# Patient Record
Sex: Male | Born: 1960 | Race: White | Hispanic: No | Marital: Single | Smoking: Current every day smoker
Health system: Southern US, Community
[De-identification: ages and names within clinical notes are randomized; demographics above are authoritative.]

## PROBLEM LIST (undated history)

## (undated) DIAGNOSIS — E119 Type 2 diabetes mellitus without complications: Secondary | ICD-10-CM

## (undated) DIAGNOSIS — R0600 Dyspnea, unspecified: Secondary | ICD-10-CM

## (undated) DIAGNOSIS — F419 Anxiety disorder, unspecified: Secondary | ICD-10-CM

## (undated) DIAGNOSIS — M1990 Unspecified osteoarthritis, unspecified site: Secondary | ICD-10-CM

## (undated) DIAGNOSIS — I10 Essential (primary) hypertension: Secondary | ICD-10-CM

## (undated) HISTORY — PX: COLONOSCOPY: SHX5424

## (undated) HISTORY — PX: CHOLECYSTECTOMY: SHX55

---

## 2017-02-28 ENCOUNTER — Other Ambulatory Visit: Payer: Self-pay | Admitting: Neurosurgery

## 2017-02-28 DIAGNOSIS — M48062 Spinal stenosis, lumbar region with neurogenic claudication: Secondary | ICD-10-CM

## 2017-03-22 ENCOUNTER — Ambulatory Visit
Admission: RE | Admit: 2017-03-22 | Discharge: 2017-03-22 | Disposition: A | Discharge status: Home / Self Care | Payer: BLUE CROSS/BLUE SHIELD | Source: Ambulatory Visit | Attending: Neurosurgery | Admitting: Neurosurgery

## 2017-03-22 DIAGNOSIS — M48062 Spinal stenosis, lumbar region with neurogenic claudication: Secondary | ICD-10-CM

## 2017-03-22 MED ORDER — ONDANSETRON HCL 4 MG/2ML IJ SOLN
4.0000 mg | Freq: Once | INTRAMUSCULAR | Status: AC
  Administered 2017-03-22: 4 mg via INTRAMUSCULAR

## 2017-03-22 MED ORDER — DIAZEPAM 5 MG PO TABS
10.0000 mg | ORAL_TABLET | Freq: Once | ORAL | Status: AC
  Administered 2017-03-22: 10 mg via ORAL

## 2017-03-22 MED ORDER — MEPERIDINE HCL 100 MG/ML IJ SOLN
75.0000 mg | Freq: Once | INTRAMUSCULAR | Status: AC
  Administered 2017-03-22: 75 mg via INTRAMUSCULAR

## 2017-03-22 MED ORDER — IOPAMIDOL (ISOVUE-M 200) INJECTION 41%
15.0000 mL | Freq: Once | INTRAMUSCULAR | Status: AC
  Administered 2017-03-22: 15 mL via INTRATHECAL

## 2017-03-22 NOTE — Discharge Instructions (Signed)
Myelogram Discharge Instructions  1. Go home and rest quietly for the next 24 hours.  It is important to lie flat for the next 24 hours.  Get up only to go to the restroom.  You may lie in the bed or on a couch on your back, your stomach, your left side or your right side.  You may have one pillow under your head.  You may have pillows between your knees while you are on your side or under your knees while you are on your back.  2. DO NOT drive today.  Recline the seat as far back as it will go, while still wearing your seat belt, on the way home.  3. You may get up to go to the bathroom as needed.  You may sit up for 10 minutes to eat.  You may resume your normal diet and medications unless otherwise indicated.  Drink lots of extra fluids today and tomorrow.  4. The incidence of headache, nausea, or vomiting is about 5% (one in 20 patients).  If you develop a headache, lie flat and drink plenty of fluids until the headache goes away.  Caffeinated beverages may be helpful.  If you develop severe nausea and vomiting or a headache that does not go away with flat bed rest, call [phone].  5. You may resume normal activities after your 24 hours of bed rest is over; however, do not exert yourself strongly or do any heavy lifting tomorrow. If when you get up you have a headache when standing, go back to bed and force fluids for another 24 hours.  6. Call your physician for a follow-up appointment.  The results of your myelogram will be sent directly to your physician by the following day.  7. If you have any questions or if complications develop after you arrive home, please call [phone].  Discharge instructions have been explained to the patient.  The patient, or the person responsible for the patient, fully understands these instructions.      May resume Buspar and Celexa on March 23, 2017, after 9:30 am.

## 2017-04-04 ENCOUNTER — Other Ambulatory Visit: Payer: Self-pay | Admitting: Neurosurgery

## 2017-04-20 NOTE — Pre-Procedure Instructions (Signed)
Caleb BravoWilliam Love  04/20/2017      [id number] - [place], [place] - [address] [place] [address] Phone: [phone] Fax: [phone]    Your procedure is scheduled on April 22  [facility] Admitting at 1045 A.M.  Call this number if you have problems the morning of surgery:  [phone]   Remember:  Do not eat food or drink liquids after midnight.  Take these medicines the morning of surgery with A SIP OF WATER alprazolam (Xanax) if needed, Symbicort inhaler if needed, Buspirone (Buspar), Carvedilol (Coreg), Cetirizine (Zyrtec), Citalopram (Celexa), Hydrocodone (Norco) if needed, Isosorbide Dinitrate (Isordil), Eye drops if needed, proair inhaler if needed  Bring your inhalers with you on the day of surgery Stop taking Aspirin, BC's, Goody's, Herbal medications, Fish Oil, Vitamins, Aleve, Ibuprofen, Advil, Motrin    How to Manage Your Diabetes Before and After Surgery  Why is it important to control my blood sugar before and after surgery? . Improving blood sugar levels before and after surgery helps healing and can limit problems. . A way of improving blood sugar control is eating a healthy diet by: o  Eating less sugar and carbohydrates o  Increasing activity/exercise o  Talking with your doctor about reaching your blood sugar goals . High blood sugars (greater than 180 mg/dL) can raise your risk of infections and slow your recovery, so you will need to focus on controlling your diabetes during the weeks before surgery. . Make sure that the doctor who takes care of your diabetes knows about your planned surgery including the date and location.  How do I manage my blood sugar before surgery? . Check your blood sugar at least 4 times a day, starting 2 days before surgery, to make sure that the level is not too high or low. o Check your blood sugar the morning of your surgery when you  wake up and every 2 hours until you get to the Short Stay unit. . If your blood sugar is less than 70 mg/dL, you will need to treat for low blood sugar: o Do not take insulin. o Treat a low blood sugar (less than 70 mg/dL) with  cup of clear juice (cranberry or apple), 4 glucose tablets, OR glucose gel. Recheck blood sugar in 15 minutes after treatment (to make sure it is greater than 70 mg/dL). If your blood sugar is not greater than 70 mg/dL on recheck, call [phone] o  for further instructions. . Report your blood sugar to the short stay nurse when you get to Short Stay.  . If you are admitted to the hospital after surgery: o Your blood sugar will be checked by the staff and you will probably be given insulin after surgery (instead of oral diabetes medicines) to make sure you have good blood sugar levels. o The goal for blood sugar control after surgery is 80-180 mg/dL.              WHAT DO I DO ABOUT MY DIABETES MEDICATION?   Marland Kitchen. Do not take oral diabetes medicines (pills) the morning of surgery. Glipizide (Glucotrol XL) or Metformin (Glucophage)     . The day of surgery, do not take other diabetes injectables, including Byetta (exenatide), Bydureon (exenatide ER), Victoza (liraglutide), or Trulicity (dulaglutide).  . If your CBG is greater than 220 mg/dL, you may take  of your sliding scale (correction) dose of  insulin.  Other Instructions:          Patient Signature:  Date:   Nurse Signature:  Date:   Reviewed and Endorsed by [facility] Patient Education Committee, August 2015  Do not wear jewelry, make-up or nail polish.  Do not wear lotions, powders, or perfumes, or deodorant.  Do not shave 48 hours prior to surgery.  Men may shave face and neck.  Do not bring valuables to the hospital.  [facility] is not responsible for any belongings or valuables.  Contacts, dentures or bridgework may not be worn into surgery.  Leave your suitcase in the car.  After  surgery it may be brought to your room.  For patients admitted to the hospital, discharge time will be determined by your treatment team.  Patients discharged the day of surgery will not be allowed to drive home.   Special instructions:   [facility]- Preparing For Surgery  Before surgery, you can play an important role. Because skin is not sterile, your skin needs to be as free of germs as possible. You can reduce the number of germs on your skin by washing with CHG (chlorahexidine gluconate) Soap before surgery.  CHG is an antiseptic cleaner which kills germs and bonds with the skin to continue killing germs even after washing.  Please do not use if you have an allergy to CHG or antibacterial soaps. If your skin becomes reddened/irritated stop using the CHG.  Do not shave (including legs and underarms) for at least 48 hours prior to first CHG shower. It is OK to shave your face.  Please follow these instructions carefully.   1. Shower the NIGHT BEFORE SURGERY and the MORNING OF SURGERY with CHG.   2. If you chose to wash your hair, wash your hair first as usual with your normal shampoo.  3. After you shampoo, rinse your hair and body thoroughly to remove the shampoo.  4. Use CHG as you would any other liquid soap. You can apply CHG directly to the skin and wash gently with a scrungie or a clean washcloth.   5. Apply the CHG Soap to your body ONLY FROM THE NECK DOWN.  Do not use on open wounds or open sores. Avoid contact with your eyes, ears, mouth and genitals (private parts). Wash Face and genitals (private parts)  with your normal soap.  6. Wash thoroughly, paying special attention to the area where your surgery will be performed.  7. Thoroughly rinse your body with warm water from the neck down.  8. DO NOT shower/wash with your normal soap after using and rinsing off the CHG Soap.  9. Pat yourself dry with a CLEAN TOWEL.  10. Wear CLEAN PAJAMAS to bed the night before surgery,  wear comfortable clothes the morning of surgery  11. Place CLEAN SHEETS on your bed the night of your first shower and DO NOT SLEEP WITH PETS.    Day of Surgery: Do not apply any deodorants/lotions. Please wear clean clothes to the hospital/surgery center.      Please read over the following fact sheets that you were given. Pain Booklet, Coughing and Deep Breathing, MRSA Information and Surgical Site Infection Prevention

## 2017-04-23 ENCOUNTER — Encounter (HOSPITAL_COMMUNITY)
Admission: RE | Admit: 2017-04-23 | Discharge: 2017-04-23 | Disposition: A | Discharge status: Home / Self Care | Payer: BLUE CROSS/BLUE SHIELD | Source: Ambulatory Visit | Attending: Neurosurgery | Admitting: Neurosurgery

## 2017-04-23 ENCOUNTER — Encounter (HOSPITAL_COMMUNITY): Payer: Self-pay

## 2017-04-23 ENCOUNTER — Other Ambulatory Visit: Payer: Self-pay

## 2017-04-23 DIAGNOSIS — Z01812 Encounter for preprocedural laboratory examination: Secondary | ICD-10-CM | POA: Insufficient documentation

## 2017-04-23 DIAGNOSIS — E119 Type 2 diabetes mellitus without complications: Secondary | ICD-10-CM | POA: Diagnosis not present

## 2017-04-23 DIAGNOSIS — I4510 Unspecified right bundle-branch block: Secondary | ICD-10-CM | POA: Diagnosis not present

## 2017-04-23 DIAGNOSIS — I10 Essential (primary) hypertension: Secondary | ICD-10-CM | POA: Diagnosis not present

## 2017-04-23 DIAGNOSIS — G4733 Obstructive sleep apnea (adult) (pediatric): Secondary | ICD-10-CM | POA: Diagnosis not present

## 2017-04-23 DIAGNOSIS — Z79899 Other long term (current) drug therapy: Secondary | ICD-10-CM | POA: Diagnosis not present

## 2017-04-23 DIAGNOSIS — Z7984 Long term (current) use of oral hypoglycemic drugs: Secondary | ICD-10-CM | POA: Diagnosis not present

## 2017-04-23 DIAGNOSIS — F419 Anxiety disorder, unspecified: Secondary | ICD-10-CM | POA: Insufficient documentation

## 2017-04-23 DIAGNOSIS — Z01810 Encounter for preprocedural cardiovascular examination: Secondary | ICD-10-CM | POA: Diagnosis not present

## 2017-04-23 HISTORY — DX: Essential (primary) hypertension: I10

## 2017-04-23 HISTORY — DX: Type 2 diabetes mellitus without complications: E11.9

## 2017-04-23 HISTORY — DX: Unspecified osteoarthritis, unspecified site: M19.90

## 2017-04-23 HISTORY — DX: Anxiety disorder, unspecified: F41.9

## 2017-04-23 HISTORY — DX: Dyspnea, unspecified: R06.00

## 2017-04-23 LAB — CBC WITH DIFFERENTIAL/PLATELET
Basophils Absolute: 0 10*3/uL (ref 0.0–0.1)
Basophils Relative: 0 %
EOS PCT: 3 %
Eosinophils Absolute: 0.4 10*3/uL (ref 0.0–0.7)
HEMATOCRIT: 49.3 % (ref 39.0–52.0)
Hemoglobin: 16.7 g/dL (ref 13.0–17.0)
LYMPHS ABS: 3.5 10*3/uL (ref 0.7–4.0)
LYMPHS PCT: 26 %
MCH: 31 pg (ref 26.0–34.0)
MCHC: 33.9 g/dL (ref 30.0–36.0)
MCV: 91.5 fL (ref 78.0–100.0)
MONO ABS: 0.6 10*3/uL (ref 0.1–1.0)
MONOS PCT: 4 %
Neutro Abs: 9.1 10*3/uL — ABNORMAL HIGH (ref 1.7–7.7)
Neutrophils Relative %: 67 %
PLATELETS: 263 10*3/uL (ref 150–400)
RBC: 5.39 MIL/uL (ref 4.22–5.81)
RDW: 13.4 % (ref 11.5–15.5)
WBC: 13.6 10*3/uL — ABNORMAL HIGH (ref 4.0–10.5)

## 2017-04-23 LAB — TYPE AND SCREEN
ABO/RH(D): A POS
Antibody Screen: NEGATIVE

## 2017-04-23 LAB — BASIC METABOLIC PANEL
ANION GAP: 10 (ref 5–15)
BUN: 15 mg/dL (ref 6–20)
CO2: 22 mmol/L (ref 22–32)
Calcium: 9.5 mg/dL (ref 8.9–10.3)
Chloride: 106 mmol/L (ref 101–111)
Creatinine, Ser: 0.8 mg/dL (ref 0.61–1.24)
Glucose, Bld: 154 mg/dL — ABNORMAL HIGH (ref 65–99)
POTASSIUM: 4.5 mmol/L (ref 3.5–5.1)
Sodium: 138 mmol/L (ref 135–145)

## 2017-04-23 LAB — SURGICAL PCR SCREEN
MRSA, PCR: NEGATIVE
Staphylococcus aureus: NEGATIVE

## 2017-04-23 LAB — ABO/RH: ABO/RH(D): A POS

## 2017-04-23 LAB — GLUCOSE, CAPILLARY: Glucose-Capillary: 198 mg/dL — ABNORMAL HIGH (ref 65–99)

## 2017-04-23 LAB — HEMOGLOBIN A1C
Hgb A1c MFr Bld: 7.3 % — ABNORMAL HIGH (ref 4.8–5.6)
MEAN PLASMA GLUCOSE: 162.81 mg/dL

## 2017-04-23 NOTE — Progress Notes (Addendum)
PCP is Dr. Antony HasteMichael Badger Denies seeing a cardiologist. Denies chest pain or fever, reports a smokers cough. Instructed not to smoke on the day of surgery voices understanding.  Reports fasting CBG's run in the 150's Denies ever having a card cath. [facility] and [facility] test [facility]. Mr Rennis Hardingllis states he was incarcerated and will need to have his monitor removed before surgery. He states he will call his parol  officer  And let him know time and date of surgery.  Stop bang sleep apnea assessment was sent to Dr Cyndia BentBadger.

## 2017-04-23 NOTE — Progress Notes (Signed)
   04/23/17 1133  OBSTRUCTIVE SLEEP APNEA  Have you ever been diagnosed with sleep apnea through a sleep study? No  Do you snore loudly (loud enough to be heard through closed doors)?  0  Do you often feel tired, fatigued, or sleepy during the daytime (such as falling asleep during driving or talking to someone)? 0  Has anyone observed you stop breathing during your sleep? 0  Do you have, or are you being treated for high blood pressure? 1  BMI more than 35 kg/m2? 1  Age > 50 (1-yes) 1  Neck circumference greater than:Male 16 inches or larger, Male 17inches or larger? 1  Male Gender (Yes=1) 1  Obstructive Sleep Apnea Score 5  Score 5 or greater  Results sent to PCP

## 2017-04-24 NOTE — Progress Notes (Signed)
Case:  [id number] Date/Time:  04/30/17 1230   Procedure:  PLIF - L3-L4 (N/A Back)   Anesthesia type:  General   Pre-op diagnosis:  Stenosis   Location:  [facility] OR ROOM 20 / [facility] OR   Surgeon:  Julio SicksPool, Henry, MD     ANESTHESIA CHART REVIEW:  DISCUSSION: Patient is a 57 year old male scheduled for the above procedure. History include smoking, HTN, DM2. OSA screening score 5. He is on parole and reports the will let his parole office about surgery because he will need the ankle monitor removed before surgery. (I will alert [facility] staff, so they can follow-up.) EKG is stable with known RBBB. Non-ischemic stress test last year. If no acute changes then I anticipate that he can proceed as planned.  VS: BP 130/73   Pulse 82   Temp 36.7 C   Resp 20   Ht 6\' 1"  (1.854 m)   Wt 270 lb 8 oz (122.7 kg)   SpO2 96%   BMI 35.69 kg/m   PROVIDERS: PCP Eartha InchBadger, Michael C, MD  LABS: Reviewed. WBC 13.6. H/H 16.7/49.3. Cr 0.80. Glucose 154. A1c 7.3.   IMAGES: CT L-spine 03/22/17: IMPRESSION: 1. Severe central canal stenosis at L3-4 with tortuosity of nerve roots above this level. 2. Mild bilateral foraminal narrowing at L3-4. 3. Moderate left and mild right subarticular narrowing at L2-3 without significant foraminal stenosis. 4. Mild subarticular and foraminal narrowing bilaterally at L4-5 is worse on the right. 5. Minimal right subarticular and foraminal stenosis at L5-S1.  EKG: 04/23/17 NSR, right BBB.   CV: Nuclear stress test 02/07/16 [place]): Impression: 1.  No evidence of ischemia on the scan. 2.  Normal ejection fraction at 56%. 3.  No significant symptoms, EKG changes, or arrhythmias occurred. EKG showed SR, right BBB.  He thought he also had an echo at [facility] in 2018, but medical records staff said it was only the Cardiolite stress test.  Past Medical History:  Diagnosis Date  . Anxiety   . Arthritis   . Diabetes mellitus without complication (HCC)   . Dyspnea   .  Hypertension     Past Surgical History:  Procedure Laterality Date  . CHOLECYSTECTOMY    . COLONOSCOPY     Meds include Xanax, Symbicort, BuSpar, Coreg, Zyrtec, Celexa, doxazosin, furosemide, glipizide, Norco, isosorbide dinitrate, metformin, Pro-air HFA.  Velna Ochsllison Gavriella Hearst, PA-C [facility] Stay [facility]/Anesthesiology Phone [phone] 04/24/2017 11:02 AM

## 2017-04-29 MED ORDER — DEXTROSE 5 % IV SOLN
3.0000 g | INTRAVENOUS | Status: AC
  Administered 2017-04-30: 3 g via INTRAVENOUS
  Filled 2017-04-29: qty 3000
  Filled 2017-04-29: qty 3

## 2017-04-30 ENCOUNTER — Inpatient Hospital Stay (HOSPITAL_COMMUNITY)
Admission: RE | Admit: 2017-04-30 | Discharge: 2017-05-01 | DRG: 455 | Disposition: A | Discharge status: Home / Self Care | Payer: BLUE CROSS/BLUE SHIELD | Source: Ambulatory Visit | Attending: Neurosurgery | Admitting: Neurosurgery

## 2017-04-30 ENCOUNTER — Inpatient Hospital Stay (HOSPITAL_COMMUNITY): Payer: BLUE CROSS/BLUE SHIELD | Admitting: Vascular Surgery

## 2017-04-30 ENCOUNTER — Inpatient Hospital Stay (HOSPITAL_COMMUNITY)
Admission: RE | Disposition: A | Discharge status: Home / Self Care | Payer: Self-pay | Source: Ambulatory Visit | Attending: Neurosurgery

## 2017-04-30 ENCOUNTER — Inpatient Hospital Stay (HOSPITAL_COMMUNITY): Payer: BLUE CROSS/BLUE SHIELD

## 2017-04-30 ENCOUNTER — Encounter (HOSPITAL_COMMUNITY): Payer: Self-pay | Admitting: Certified Registered"

## 2017-04-30 ENCOUNTER — Inpatient Hospital Stay (HOSPITAL_COMMUNITY): Payer: BLUE CROSS/BLUE SHIELD | Admitting: Anesthesiology

## 2017-04-30 DIAGNOSIS — Z6835 Body mass index (BMI) 35.0-35.9, adult: Secondary | ICD-10-CM | POA: Diagnosis not present

## 2017-04-30 DIAGNOSIS — M47816 Spondylosis without myelopathy or radiculopathy, lumbar region: Secondary | ICD-10-CM | POA: Diagnosis present

## 2017-04-30 DIAGNOSIS — E119 Type 2 diabetes mellitus without complications: Secondary | ICD-10-CM | POA: Diagnosis present

## 2017-04-30 DIAGNOSIS — Z833 Family history of diabetes mellitus: Secondary | ICD-10-CM

## 2017-04-30 DIAGNOSIS — I10 Essential (primary) hypertension: Secondary | ICD-10-CM | POA: Diagnosis present

## 2017-04-30 DIAGNOSIS — F17210 Nicotine dependence, cigarettes, uncomplicated: Secondary | ICD-10-CM | POA: Diagnosis present

## 2017-04-30 DIAGNOSIS — M48062 Spinal stenosis, lumbar region with neurogenic claudication: Principal | ICD-10-CM | POA: Diagnosis present

## 2017-04-30 DIAGNOSIS — F419 Anxiety disorder, unspecified: Secondary | ICD-10-CM | POA: Diagnosis present

## 2017-04-30 DIAGNOSIS — Z888 Allergy status to other drugs, medicaments and biological substances status: Secondary | ICD-10-CM

## 2017-04-30 DIAGNOSIS — M48061 Spinal stenosis, lumbar region without neurogenic claudication: Secondary | ICD-10-CM | POA: Diagnosis present

## 2017-04-30 DIAGNOSIS — Z7984 Long term (current) use of oral hypoglycemic drugs: Secondary | ICD-10-CM | POA: Diagnosis not present

## 2017-04-30 DIAGNOSIS — M4310 Spondylolisthesis, site unspecified: Secondary | ICD-10-CM | POA: Diagnosis present

## 2017-04-30 DIAGNOSIS — Z91030 Bee allergy status: Secondary | ICD-10-CM

## 2017-04-30 DIAGNOSIS — Z7951 Long term (current) use of inhaled steroids: Secondary | ICD-10-CM | POA: Diagnosis not present

## 2017-04-30 DIAGNOSIS — Z419 Encounter for procedure for purposes other than remedying health state, unspecified: Secondary | ICD-10-CM

## 2017-04-30 DIAGNOSIS — M1990 Unspecified osteoarthritis, unspecified site: Secondary | ICD-10-CM | POA: Diagnosis present

## 2017-04-30 DIAGNOSIS — M4316 Spondylolisthesis, lumbar region: Secondary | ICD-10-CM | POA: Diagnosis present

## 2017-04-30 LAB — GLUCOSE, CAPILLARY
GLUCOSE-CAPILLARY: 187 mg/dL — AB (ref 65–99)
Glucose-Capillary: 162 mg/dL — ABNORMAL HIGH (ref 65–99)
Glucose-Capillary: 135 mg/dL — ABNORMAL HIGH (ref 65–99)

## 2017-04-30 SURGERY — POSTERIOR LUMBAR FUSION 1 LEVEL
Anesthesia: General | Site: Back

## 2017-04-30 MED ORDER — TAMSULOSIN HCL 0.4 MG PO CAPS
0.4000 mg | ORAL_CAPSULE | Freq: Every day | ORAL | Status: DC
  Administered 2017-05-01: 0.4 mg via ORAL
  Filled 2017-04-30 (×2): qty 1

## 2017-04-30 MED ORDER — DEXAMETHASONE SODIUM PHOSPHATE 10 MG/ML IJ SOLN
INTRAMUSCULAR | Status: AC
  Filled 2017-04-30: qty 1

## 2017-04-30 MED ORDER — VANCOMYCIN HCL 1000 MG IV SOLR
INTRAVENOUS | Status: AC
  Filled 2017-04-30: qty 1000

## 2017-04-30 MED ORDER — IBUPROFEN 200 MG PO TABS
200.0000 mg | ORAL_TABLET | Freq: Three times a day (TID) | ORAL | Status: DC | PRN
  Administered 2017-05-01: 400 mg via ORAL
  Filled 2017-04-30: qty 2

## 2017-04-30 MED ORDER — SODIUM CHLORIDE 0.9 % IV SOLN
250.0000 mL | INTRAVENOUS | Status: DC
Start: 2017-04-30 — End: 2017-05-01

## 2017-04-30 MED ORDER — ALBUTEROL SULFATE (2.5 MG/3ML) 0.083% IN NEBU: 2.5000 mg | INHALATION_SOLUTION | Freq: Four times a day (QID) | RESPIRATORY_TRACT | Status: DC | PRN

## 2017-04-30 MED ORDER — ISOSORBIDE DINITRATE 20 MG PO TABS
20.0000 mg | ORAL_TABLET | Freq: Every day | ORAL | Status: DC
  Administered 2017-05-01: 20 mg via ORAL
  Filled 2017-04-30: qty 1

## 2017-04-30 MED ORDER — CHLORHEXIDINE GLUCONATE CLOTH 2 % EX PADS: 6.0000 | MEDICATED_PAD | Freq: Once | CUTANEOUS | Status: DC

## 2017-04-30 MED ORDER — ADULT MULTIVITAMIN W/MINERALS CH
1.0000 | ORAL_TABLET | Freq: Every day | ORAL | Status: DC
  Administered 2017-05-01: 1 via ORAL
  Filled 2017-04-30: qty 1

## 2017-04-30 MED ORDER — PHENOL 1.4 % MT LIQD: 1.0000 | OROMUCOSAL | Status: DC | PRN

## 2017-04-30 MED ORDER — ALBUTEROL SULFATE HFA 108 (90 BASE) MCG/ACT IN AERS: 2.0000 | INHALATION_SPRAY | Freq: Four times a day (QID) | RESPIRATORY_TRACT | Status: DC | PRN

## 2017-04-30 MED ORDER — CARVEDILOL 12.5 MG PO TABS
12.5000 mg | ORAL_TABLET | Freq: Two times a day (BID) | ORAL | Status: DC
  Administered 2017-04-30 – 2017-05-01 (×2): 12.5 mg via ORAL
  Filled 2017-04-30 (×2): qty 1

## 2017-04-30 MED ORDER — SUFENTANIL CITRATE 50 MCG/ML IV SOLN
INTRAVENOUS | Status: AC
  Filled 2017-04-30: qty 1

## 2017-04-30 MED ORDER — SUFENTANIL CITRATE 50 MCG/ML IV SOLN
INTRAVENOUS | Status: DC | PRN
  Administered 2017-04-30: 10 ug via INTRAVENOUS
  Administered 2017-04-30: 5 ug via INTRAVENOUS
  Administered 2017-04-30: 10 ug via INTRAVENOUS

## 2017-04-30 MED ORDER — HYDROMORPHONE HCL 2 MG/ML IJ SOLN
INTRAMUSCULAR | Status: AC
  Filled 2017-04-30: qty 1

## 2017-04-30 MED ORDER — NAPHAZOLINE-PHENIRAMINE 0.025-0.3 % OP SOLN: 1.0000 [drp] | Freq: Four times a day (QID) | OPHTHALMIC | Status: DC | PRN

## 2017-04-30 MED ORDER — CHOLECALCIFEROL 125 MCG (5000 UT) PO CAPS: 5000.0000 [IU] | ORAL_CAPSULE | ORAL | Status: DC

## 2017-04-30 MED ORDER — ROCURONIUM BROMIDE 10 MG/ML (PF) SYRINGE
PREFILLED_SYRINGE | INTRAVENOUS | Status: AC
  Filled 2017-04-30: qty 10

## 2017-04-30 MED ORDER — BACITRACIN 50000 UNITS IM SOLR
INTRAMUSCULAR | Status: DC | PRN
  Administered 2017-04-30: 500 mL

## 2017-04-30 MED ORDER — POLYETHYLENE GLYCOL 3350 17 G PO PACK: 17.0000 g | PACK | Freq: Every day | ORAL | Status: DC | PRN

## 2017-04-30 MED ORDER — SUCCINYLCHOLINE CHLORIDE 200 MG/10ML IV SOSY
PREFILLED_SYRINGE | INTRAVENOUS | Status: AC
  Filled 2017-04-30: qty 10

## 2017-04-30 MED ORDER — ONDANSETRON HCL 4 MG PO TABS: 4.0000 mg | ORAL_TABLET | Freq: Four times a day (QID) | ORAL | Status: DC | PRN

## 2017-04-30 MED ORDER — HYDROCODONE-ACETAMINOPHEN 5-325 MG PO TABS: 1.0000 | ORAL_TABLET | Freq: Three times a day (TID) | ORAL | Status: DC | PRN

## 2017-04-30 MED ORDER — ONDANSETRON HCL 4 MG/2ML IJ SOLN
INTRAMUSCULAR | Status: DC | PRN
  Administered 2017-04-30: 4 mg via INTRAVENOUS

## 2017-04-30 MED ORDER — VANCOMYCIN HCL 1 G IV SOLR
INTRAVENOUS | Status: DC | PRN
  Administered 2017-04-30: 1000 mg

## 2017-04-30 MED ORDER — BUPIVACAINE HCL (PF) 0.25 % IJ SOLN
INTRAMUSCULAR | Status: DC | PRN
  Administered 2017-04-30: 20 mL

## 2017-04-30 MED ORDER — ROCURONIUM BROMIDE 100 MG/10ML IV SOLN
INTRAVENOUS | Status: DC | PRN
  Administered 2017-04-30: 60 mg via INTRAVENOUS
  Administered 2017-04-30 (×2): 10 mg via INTRAVENOUS

## 2017-04-30 MED ORDER — THROMBIN (RECOMBINANT) 20000 UNITS EX SOLR
CUTANEOUS | Status: DC | PRN
  Administered 2017-04-30: 20 mL via TOPICAL

## 2017-04-30 MED ORDER — 0.9 % SODIUM CHLORIDE (POUR BTL) OPTIME
TOPICAL | Status: DC | PRN
  Administered 2017-04-30: 1000 mL

## 2017-04-30 MED ORDER — BISACODYL 10 MG RE SUPP: 10.0000 mg | Freq: Every day | RECTAL | Status: DC | PRN

## 2017-04-30 MED ORDER — FENTANYL CITRATE (PF) 100 MCG/2ML IJ SOLN: INTRAMUSCULAR | Status: DC | PRN

## 2017-04-30 MED ORDER — SUGAMMADEX SODIUM 200 MG/2ML IV SOLN
INTRAVENOUS | Status: DC | PRN
  Administered 2017-04-30: 245.4 mg via INTRAVENOUS

## 2017-04-30 MED ORDER — VITAMIN B-12 1000 MCG PO TABS
1000.0000 ug | ORAL_TABLET | Freq: Every day | ORAL | Status: DC
  Administered 2017-05-01: 1000 ug via ORAL
  Filled 2017-04-30: qty 1

## 2017-04-30 MED ORDER — LIDOCAINE HCL (CARDIAC) PF 100 MG/5ML IV SOSY
PREFILLED_SYRINGE | INTRAVENOUS | Status: DC | PRN
  Administered 2017-04-30: 30 mg via INTRAVENOUS

## 2017-04-30 MED ORDER — SUGAMMADEX SODIUM 500 MG/5ML IV SOLN
INTRAVENOUS | Status: AC
  Filled 2017-04-30: qty 5

## 2017-04-30 MED ORDER — ONDANSETRON HCL 4 MG/2ML IJ SOLN
INTRAMUSCULAR | Status: AC
  Filled 2017-04-30: qty 2

## 2017-04-30 MED ORDER — CYANOCOBALAMIN 1000 MCG/ML IJ SOLN: 1000.0000 ug | INTRAMUSCULAR | Status: DC

## 2017-04-30 MED ORDER — DOXAZOSIN MESYLATE 4 MG PO TABS
4.0000 mg | ORAL_TABLET | Freq: Every day | ORAL | Status: DC
  Administered 2017-04-30: 4 mg via ORAL
  Filled 2017-04-30 (×2): qty 1

## 2017-04-30 MED ORDER — SODIUM CHLORIDE 0.9% FLUSH
3.0000 mL | Freq: Two times a day (BID) | INTRAVENOUS | Status: DC
  Administered 2017-04-30: 3 mL via INTRAVENOUS

## 2017-04-30 MED ORDER — MOMETASONE FURO-FORMOTEROL FUM 100-5 MCG/ACT IN AERO
2.0000 | INHALATION_SPRAY | Freq: Two times a day (BID) | RESPIRATORY_TRACT | Status: DC
  Filled 2017-04-30: qty 8.8

## 2017-04-30 MED ORDER — LORATADINE 10 MG PO TABS
10.0000 mg | ORAL_TABLET | Freq: Every day | ORAL | Status: DC
  Administered 2017-04-30 – 2017-05-01 (×2): 10 mg via ORAL
  Filled 2017-04-30 (×2): qty 1

## 2017-04-30 MED ORDER — MIDAZOLAM HCL 2 MG/2ML IJ SOLN
INTRAMUSCULAR | Status: AC
  Filled 2017-04-30: qty 2

## 2017-04-30 MED ORDER — PROPOFOL 10 MG/ML IV BOLUS
INTRAVENOUS | Status: DC | PRN
  Administered 2017-04-30: 200 mg via INTRAVENOUS

## 2017-04-30 MED ORDER — PHENYLEPHRINE 40 MCG/ML (10ML) SYRINGE FOR IV PUSH (FOR BLOOD PRESSURE SUPPORT)
PREFILLED_SYRINGE | INTRAVENOUS | Status: AC
  Filled 2017-04-30: qty 20

## 2017-04-30 MED ORDER — DIAZEPAM 5 MG PO TABS
5.0000 mg | ORAL_TABLET | Freq: Four times a day (QID) | ORAL | Status: DC | PRN
  Administered 2017-04-30 – 2017-05-01 (×2): 5 mg via ORAL
  Administered 2017-05-01: 10 mg via ORAL
  Filled 2017-04-30 (×3): qty 1

## 2017-04-30 MED ORDER — CITALOPRAM HYDROBROMIDE 20 MG PO TABS
20.0000 mg | ORAL_TABLET | Freq: Every day | ORAL | Status: DC
  Administered 2017-05-01: 20 mg via ORAL
  Filled 2017-04-30: qty 1

## 2017-04-30 MED ORDER — ACETAMINOPHEN 650 MG RE SUPP: 650.0000 mg | RECTAL | Status: DC | PRN

## 2017-04-30 MED ORDER — LIDOCAINE 2% (20 MG/ML) 5 ML SYRINGE
INTRAMUSCULAR | Status: AC
  Filled 2017-04-30: qty 5

## 2017-04-30 MED ORDER — GLIPIZIDE ER 10 MG PO TB24
10.0000 mg | ORAL_TABLET | Freq: Every day | ORAL | Status: DC
  Administered 2017-05-01: 10 mg via ORAL
  Filled 2017-04-30: qty 1

## 2017-04-30 MED ORDER — FLEET ENEMA 7-19 GM/118ML RE ENEM: 1.0000 | ENEMA | Freq: Once | RECTAL | Status: DC | PRN

## 2017-04-30 MED ORDER — DEXAMETHASONE SODIUM PHOSPHATE 10 MG/ML IJ SOLN
10.0000 mg | INTRAMUSCULAR | Status: AC
  Administered 2017-04-30: 10 mg via INTRAVENOUS

## 2017-04-30 MED ORDER — ROCURONIUM BROMIDE 10 MG/ML (PF) SYRINGE
PREFILLED_SYRINGE | INTRAVENOUS | Status: AC
  Filled 2017-04-30: qty 5

## 2017-04-30 MED ORDER — ONDANSETRON HCL 4 MG/2ML IJ SOLN
INTRAMUSCULAR | Status: AC
  Filled 2017-04-30: qty 4

## 2017-04-30 MED ORDER — HYDROMORPHONE HCL 1 MG/ML IJ SOLN: 1.0000 mg | INTRAMUSCULAR | Status: DC | PRN

## 2017-04-30 MED ORDER — FUROSEMIDE 40 MG PO TABS
40.0000 mg | ORAL_TABLET | Freq: Every day | ORAL | Status: DC
  Administered 2017-05-01: 40 mg via ORAL
  Filled 2017-04-30: qty 1

## 2017-04-30 MED ORDER — ONDANSETRON HCL 4 MG/2ML IJ SOLN: 4.0000 mg | Freq: Four times a day (QID) | INTRAMUSCULAR | Status: DC | PRN

## 2017-04-30 MED ORDER — CEFAZOLIN SODIUM-DEXTROSE 1-4 GM/50ML-% IV SOLN
1.0000 g | Freq: Three times a day (TID) | INTRAVENOUS | Status: AC
  Administered 2017-04-30 – 2017-05-01 (×2): 1 g via INTRAVENOUS
  Filled 2017-04-30: qty 50

## 2017-04-30 MED ORDER — PROPOFOL 10 MG/ML IV BOLUS
INTRAVENOUS | Status: AC
  Filled 2017-04-30: qty 20

## 2017-04-30 MED ORDER — BUPIVACAINE HCL (PF) 0.25 % IJ SOLN
INTRAMUSCULAR | Status: AC
  Filled 2017-04-30: qty 30

## 2017-04-30 MED ORDER — METFORMIN HCL 500 MG PO TABS
1000.0000 mg | ORAL_TABLET | Freq: Two times a day (BID) | ORAL | Status: DC
  Administered 2017-04-30 – 2017-05-01 (×2): 1000 mg via ORAL
  Filled 2017-04-30 (×2): qty 2

## 2017-04-30 MED ORDER — LACTATED RINGERS IV SOLN
INTRAVENOUS | Status: DC
  Administered 2017-04-30: 50 mL/h via INTRAVENOUS

## 2017-04-30 MED ORDER — MIDAZOLAM HCL 5 MG/5ML IJ SOLN
INTRAMUSCULAR | Status: DC | PRN
  Administered 2017-04-30: 2 mg via INTRAVENOUS

## 2017-04-30 MED ORDER — BUSPIRONE HCL 10 MG PO TABS
10.0000 mg | ORAL_TABLET | Freq: Three times a day (TID) | ORAL | Status: DC | PRN
  Filled 2017-04-30: qty 1

## 2017-04-30 MED ORDER — MENTHOL 3 MG MT LOZG: 1.0000 | LOZENGE | OROMUCOSAL | Status: DC | PRN

## 2017-04-30 MED ORDER — ALPRAZOLAM 0.5 MG PO TABS
0.5000 mg | ORAL_TABLET | Freq: Every day | ORAL | Status: DC | PRN
  Administered 2017-04-30: 0.5 mg via ORAL
  Filled 2017-04-30: qty 1

## 2017-04-30 MED ORDER — ACETAMINOPHEN 325 MG PO TABS
650.0000 mg | ORAL_TABLET | ORAL | Status: DC | PRN
Start: 2017-04-30 — End: 2017-05-01

## 2017-04-30 MED ORDER — HYDROCODONE-ACETAMINOPHEN 10-325 MG PO TABS: 1.0000 | ORAL_TABLET | ORAL | Status: DC | PRN

## 2017-04-30 MED ORDER — LACTATED RINGERS IV SOLN
INTRAVENOUS | Status: DC | PRN
  Administered 2017-04-30 (×2): via INTRAVENOUS

## 2017-04-30 MED ORDER — HYDROMORPHONE HCL 2 MG/ML IJ SOLN
0.2500 mg | INTRAMUSCULAR | Status: DC | PRN
  Administered 2017-04-30: 0.5 mg via INTRAVENOUS

## 2017-04-30 MED ORDER — PROMETHAZINE HCL 25 MG/ML IJ SOLN: 6.2500 mg | INTRAMUSCULAR | Status: DC | PRN

## 2017-04-30 MED ORDER — SODIUM CHLORIDE 0.9% FLUSH: 3.0000 mL | INTRAVENOUS | Status: DC | PRN

## 2017-04-30 MED ORDER — OXYCODONE HCL 5 MG PO TABS
10.0000 mg | ORAL_TABLET | ORAL | Status: DC | PRN
  Administered 2017-04-30 – 2017-05-01 (×5): 10 mg via ORAL
  Filled 2017-04-30 (×5): qty 2

## 2017-04-30 MED ORDER — THROMBIN 20000 UNITS EX SOLR
CUTANEOUS | Status: AC
  Filled 2017-04-30: qty 20000

## 2017-04-30 SURGICAL SUPPLY — 62 items
BAG DECANTER FOR FLEXI CONT (MISCELLANEOUS) ×3 IMPLANT
BENZOIN TINCTURE PRP APPL 2/3 (GAUZE/BANDAGES/DRESSINGS) ×3 IMPLANT
BLADE CLIPPER SURG (BLADE) ×3 IMPLANT
BUR CUTTER 7.0 ROUND (BURR) IMPLANT
BUR MATCHSTICK NEURO 3.0 LAGG (BURR) ×3 IMPLANT
CANISTER SUCT 3000ML PPV (MISCELLANEOUS) ×3 IMPLANT
CAP LCK SPNE (Orthopedic Implant) ×4 IMPLANT
CAP LOCK SPINE RADIUS (Orthopedic Implant) ×4 IMPLANT
CAP LOCKING (Orthopedic Implant) ×8 IMPLANT
CARTRIDGE OIL MAESTRO DRILL (MISCELLANEOUS) ×1 IMPLANT
CLOSURE WOUND 1/2 X4 (GAUZE/BANDAGES/DRESSINGS) ×1
CONT SPEC 4OZ CLIKSEAL STRL BL (MISCELLANEOUS) ×3 IMPLANT
COVER BACK TABLE 60X90IN (DRAPES) ×3 IMPLANT
DECANTER SPIKE VIAL GLASS SM (MISCELLANEOUS) ×3 IMPLANT
DERMABOND ADVANCED (GAUZE/BANDAGES/DRESSINGS) ×2
DERMABOND ADVANCED .7 DNX12 (GAUZE/BANDAGES/DRESSINGS) ×1 IMPLANT
DEVICE INTERBODY ELEVATE 10X23 (Cage) ×6 IMPLANT
DIFFUSER DRILL AIR PNEUMATIC (MISCELLANEOUS) ×3 IMPLANT
DRAPE C-ARM 42X72 X-RAY (DRAPES) ×6 IMPLANT
DRAPE HALF SHEET 40X57 (DRAPES) ×3 IMPLANT
DRAPE LAPAROTOMY 100X72X124 (DRAPES) ×3 IMPLANT
DRAPE SURG 17X23 STRL (DRAPES) ×12 IMPLANT
DRSG OPSITE POSTOP 4X6 (GAUZE/BANDAGES/DRESSINGS) ×6 IMPLANT
DURAPREP 26ML APPLICATOR (WOUND CARE) ×3 IMPLANT
ELECT REM PT RETURN 9FT ADLT (ELECTROSURGICAL) ×3
ELECTRODE REM PT RTRN 9FT ADLT (ELECTROSURGICAL) ×1 IMPLANT
EVACUATOR 1/8 PVC DRAIN (DRAIN) IMPLANT
GAUZE SPONGE 4X4 12PLY STRL (GAUZE/BANDAGES/DRESSINGS) IMPLANT
GAUZE SPONGE 4X4 16PLY XRAY LF (GAUZE/BANDAGES/DRESSINGS) IMPLANT
GLOVE BIOGEL PI IND STRL 6.5 (GLOVE) ×6 IMPLANT
GLOVE BIOGEL PI INDICATOR 6.5 (GLOVE) ×12
GLOVE ECLIPSE 9.0 STRL (GLOVE) ×6 IMPLANT
GLOVE EXAM NITRILE LRG STRL (GLOVE) IMPLANT
GLOVE EXAM NITRILE XL STR (GLOVE) IMPLANT
GLOVE EXAM NITRILE XS STR PU (GLOVE) IMPLANT
GLOVE SURG SS PI 6.0 STRL IVOR (GLOVE) ×9 IMPLANT
GLOVE SURG SS PI 6.5 STRL IVOR (GLOVE) ×18 IMPLANT
GOWN STRL REUS W/ TWL LRG LVL3 (GOWN DISPOSABLE) ×3 IMPLANT
GOWN STRL REUS W/ TWL XL LVL3 (GOWN DISPOSABLE) ×2 IMPLANT
GOWN STRL REUS W/TWL 2XL LVL3 (GOWN DISPOSABLE) IMPLANT
GOWN STRL REUS W/TWL LRG LVL3 (GOWN DISPOSABLE) ×6
GOWN STRL REUS W/TWL XL LVL3 (GOWN DISPOSABLE) ×4
KIT BASIN OR (CUSTOM PROCEDURE TRAY) ×3 IMPLANT
KIT TURNOVER KIT B (KITS) ×3 IMPLANT
MILL MEDIUM DISP (BLADE) ×3 IMPLANT
NEEDLE HYPO 22GX1.5 SAFETY (NEEDLE) ×3 IMPLANT
NS IRRIG 1000ML POUR BTL (IV SOLUTION) ×3 IMPLANT
OIL CARTRIDGE MAESTRO DRILL (MISCELLANEOUS) ×3
PACK LAMINECTOMY NEURO (CUSTOM PROCEDURE TRAY) ×3 IMPLANT
ROD RADIUS 40MM (Neuro Prosthesis/Implant) ×4 IMPLANT
ROD SPNL 40X5.5XNS TI RDS (Neuro Prosthesis/Implant) ×2 IMPLANT
SCREW 6.75X45MM (Screw) ×12 IMPLANT
SPONGE SURGIFOAM ABS GEL 100 (HEMOSTASIS) ×3 IMPLANT
STRIP CLOSURE SKIN 1/2X4 (GAUZE/BANDAGES/DRESSINGS) ×2 IMPLANT
SUT VIC AB 0 CT1 18XCR BRD8 (SUTURE) ×1 IMPLANT
SUT VIC AB 0 CT1 8-18 (SUTURE) ×2
SUT VIC AB 2-0 CT1 18 (SUTURE) ×3 IMPLANT
SUT VIC AB 3-0 SH 8-18 (SUTURE) ×6 IMPLANT
TOWEL GREEN STERILE (TOWEL DISPOSABLE) ×3 IMPLANT
TOWEL GREEN STERILE FF (TOWEL DISPOSABLE) ×3 IMPLANT
TRAY FOLEY W/METER SILVER 16FR (SET/KITS/TRAYS/PACK) ×3 IMPLANT
WATER STERILE IRR 1000ML POUR (IV SOLUTION) ×3 IMPLANT

## 2017-04-30 NOTE — Brief Op Note (Signed)
04/30/2017  3:29 PM  PATIENT:  Marijean BravoWilliam Sarkisyan  57 y.o. male  PRE-OPERATIVE DIAGNOSIS:  Stenosis  POST-OPERATIVE DIAGNOSIS:  Stenosis  PROCEDURE:  Procedure(s): POSTERIOR LUMBAR INTERBODY FUSION - LUMBAR THREE-LUMBAR FOUR (N/A)  SURGEON:  Surgeon(s) and Role:    * Julio SicksPool, Tenesia Escudero, MD - Primary  PHYSICIAN ASSISTANT:   ASSISTANTS:    ANESTHESIA:   general  EBL:  200 mL   BLOOD ADMINISTERED:none  DRAINS: none   LOCAL MEDICATIONS USED:  MARCAINE     SPECIMEN:  No Specimen  DISPOSITION OF SPECIMEN:  N/A  COUNTS:  YES  TOURNIQUET:  * No tourniquets in log *  DICTATION: .Dragon Dictation  PLAN OF CARE: Admit to inpatient   PATIENT DISPOSITION:  PACU - hemodynamically stable.   Delay start of Pharmacological VTE agent (>24hrs) due to surgical blood loss or risk of bleeding: yes

## 2017-04-30 NOTE — Op Note (Signed)
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  L3-4 posterior lumbar interbody fusion utilizing interbody cages and locally harvested autograft  L3-4 posterior lateral arthrodesis utilizing nonsegmental pedicle screw fixation and local autografting.  Surgeon:Notnamed Scholz A.Shiniqua Groseclose, M.D.  Asst. Surgeon: None  Anesthesia: General  Indication: 57 year old male with severe back and bilateral lower extremity pain failing conservative management.  Workup demonstrates evidence of severe spinal stenosis with an early grade 1 degenerative spondylolisthesis at L3-4.  Patient presents now for decompression and fusion in hopes of improving his symptoms.  Operative note: After induction of anesthesia, patient position prone on the Wilson frame and appropriately padded.  Lumbar region prepped and draped sterilely.  Incision made overlying L3-4.  Dissection performed bilaterally.  Retractor placed.  X-ray taken.  Level confirmed.  Laminotomy was then performed using Leksell rongeurs Kerrison Rogers and high-speed drill to remove the inferior aspect of the lamina at L3 the entire pars articularis and inferior facet of L3 bilaterally and the majority of the L4 superior facet.  The superior aspect of the L4 lamina was also resected.  Foraminotomies were completed on the course exiting L3 and L4 nerve roots in excess would be required for interbody fusion alone.  Epidural venous plexus coagulated and cut.  Bilateral discectomies then performed at L3-4.  The space then prepared for interbody fusion.  With a distractor placed the patient's right side the space was further  cleaned of all soft tissue and a 10 mm standard expandable cage from [employer] packed with  locally harvested autograft was then impacted into place and expanded to its full extent.  Distractor removed patient's right side.  The space prepared for interbody fusion on the right side.  Soft tissue removed from the interspace.  Morselized autograft packed in the interspace.  A second cage packed with autograft was then impacted in place and expanded to its full extent.  Pedicles of L3 and L4 were then identified using superficial landmarks and intraoperative fluoroscopy.  Superficial bone overlying the pedicle was then removed using high-speed drill.  Pedicle was then probed using pedicle all each pedicle tract was then tapped with a screw tapped and probed once again.  6.75 mm radius bran screws from [employer] were placed bilaterally at L3 and L4.  Final images reveal good position of the cages and the hardware at the proper operative level with normal alignment of the spine.  Wound is then irrigated one one final time.  Gelfoam was placed topically for hemostasis.  Short segment titanium rod placed over the screw heads at L3 and L4.  Locking caps placed over the screws and locking caps and engaged with a construct under compression.  Transverse processes and residual facets decorticated.  Morselized autograft was packed posterior laterally for later fusion.  Vancomycin powder was placed in deep wound space.  Wounds and closed in layers with Vicryl sutures.  Steri-Strips and sterile dressing were applied.  No apparent complications.  Patient tolerated the procedure well and he returns to the recovery room postop.

## 2017-04-30 NOTE — Progress Notes (Signed)
Orthopedic Tech Progress Note Patient Details:  Caleb BravoWilliam Love 07/16/1960 [id number] Patient has brace. Patient ID: Caleb BravoWilliam Love, male   DOB: 07/28/1960, 57 y.o.   MRN: [id number]   Caleb Love, Caleb Love 04/30/2017, 4:55 PM

## 2017-04-30 NOTE — H&P (Signed)
Caleb BravoWilliam Love is an 57 y.o. male.   Chief Complaint: Back pain HPI: 57 year old male with progressive lumbar pain with radiation into his lower extremities right greater than left.  Symptoms of failed conservative management.  Workup demonstrates evidence of marked lumbar stenosis with severe spondylosis and early grade 1 degenerative spondylolisthesis at L3-4.  Patient has failed conservative management and presents now for decompression and fusion surgery in hopes of improving his symptoms.  Past Medical History:  Diagnosis Date  . Anxiety   . Arthritis   . Diabetes mellitus without complication (HCC)   . Dyspnea   . Hypertension     Past Surgical History:  Procedure Laterality Date  . CHOLECYSTECTOMY    . COLONOSCOPY      Family History  Problem Relation Age of Onset  . Diabetes Mother   . Diabetes Father    Social History:  reports that he has been smoking cigarettes.  He has been smoking about 0.50 packs per day. He uses smokeless tobacco. He reports that he does not drink alcohol or use drugs.  Allergies:  Allergies  Allergen Reactions  . Baclofen Anaphylaxis    Was taking w/gabapentin  . Bee Venom Swelling  . Hydrochlorothiazide Swelling and Other (See Comments)    Angioedema, tenses up  . Neurontin [Gabapentin] Anaphylaxis    Was taking w/baclofen     Medications Prior to Admission  Medication Sig Dispense Refill  . ALPRAZolam (XANAX) 0.5 MG tablet Take 0.5 mg by mouth daily as needed for anxiety.     . budesonide-formoterol (SYMBICORT) 80-4.5 MCG/ACT inhaler Inhale 2 puffs into the lungs 2 (two) times daily.    . busPIRone (BUSPAR) 10 MG tablet Take 10 mg by mouth See admin instructions. 2-3 times a day for anxiety    . carvedilol (COREG) 12.5 MG tablet Take 12.5 mg by mouth 2 (two) times daily with a meal.    . cetirizine (ZYRTEC) 10 MG tablet Take 10 mg by mouth daily.    . Cholecalciferol (D-3-5) 5000 units capsule Take 5,000 Units by mouth 2 (two) times a  week.     . citalopram (CELEXA) 20 MG tablet Take 20 mg by mouth daily.    . cyanocobalamin (,VITAMIN B-12,) 1000 MCG/ML injection Inject 1,000 mcg into the muscle every 30 (thirty) days.    Marland Kitchen. doxazosin (CARDURA) 4 MG tablet Take 4 mg by mouth at bedtime.     . furosemide (LASIX) 40 MG tablet Take 40 mg by mouth daily.     Marland Kitchen. glipiZIDE (GLUCOTROL XL) 10 MG 24 hr tablet Take 10 mg by mouth daily.  3  . HYDROcodone-acetaminophen (NORCO/VICODIN) 5-325 MG tablet Take 1 tablet by mouth every 8 (eight) hours as needed (for pain.).     Marland Kitchen. ibuprofen (ADVIL,MOTRIN) 200 MG tablet Take 200-400 mg by mouth every 8 (eight) hours as needed (for pain.).     Marland Kitchen. isosorbide dinitrate (ISORDIL) 20 MG tablet Take 20 mg by mouth daily.     . metFORMIN (GLUCOPHAGE) 1000 MG tablet Take 1,000 mg by mouth 2 (two) times daily with a meal.    . Multiple Vitamin (MULTIVITAMIN WITH MINERALS) TABS tablet Take 1 tablet by mouth daily.    . naphazoline-pheniramine (NAPHCON-A) 0.025-0.3 % ophthalmic solution Place 1 drop into both eyes 4 (four) times daily as needed for eye irritation.     Marland Kitchen. PROAIR HFA 108 (90 Base) MCG/ACT inhaler Inhale 2 puffs into the lungs every 6 (six) hours as needed for wheezing.  5  . vitamin B-12 (CYANOCOBALAMIN) 1000 MCG tablet Take 1,000 mcg by mouth daily.      Results for orders placed or performed during the hospital encounter of 04/30/17 (from the past 48 hour(s))  Glucose, capillary     Status: Abnormal   Collection Time: 04/30/17 11:00 AM  Result Value Ref Range   Glucose-Capillary 162 (H) 65 - 99 mg/dL   Comment 1 Notify RN    Comment 2 Document in Chart    No results found.  Pertinent items noted in HPI and remainder of comprehensive ROS otherwise negative.  Blood pressure 120/83, pulse 89, temperature 98.1 F (36.7 C), temperature source Oral, resp. rate 20, SpO2 96 %.  Patient is awake and alert.  He is oriented and appropriate.  Cranial nerve function normal bilaterally.  Speech  fluent.  Judgment and insight intact.  Cranial nerve function normal bilaterally.  Motor examination extremities with some mild weakness of his right quadriceps muscle group.  Sensory examination with decreased sensation to pinprick light touch in his right L4 dermatome.  Deep tender presents normoactive except his Achilles reflexes are absent bilaterally.  No evidence of long track signs.  Gait and posture are essentially normal.  Examination head ears eyes nose throat is unremarkable her chest and abdomen are benign.  Extremities are free from injury deformity. Assessment/Plan L3-4 degenerative spondylolisthesis with severe stenosis and neurogenic claudication.  Plan bilateral L3-4 decompressive laminotomies and foraminotomies followed by posterior lumbar interbody fusion utilizing interbody cages, locally harvested autograft, and augmented with posterior lateral arthrodesis utilizing nonsegmental pedicle screw fixation.  Risks and benefits of been explained.  Patient wishes to proceed.  Caleb Love Caleb Love 04/30/2017, 12:43 PM

## 2017-04-30 NOTE — Anesthesia Preprocedure Evaluation (Addendum)
Anesthesia Evaluation  Patient identified by MRN, date of birth, ID band Patient awake    Reviewed: Allergy & Precautions, NPO status , Patient's Chart, lab work & pertinent test results, reviewed documented beta blocker date and time   Airway Mallampati: III  TM Distance: >3 FB Neck ROM: Full  Mouth opening: Limited Mouth Opening  Dental  (+) Dental Advisory Given   Pulmonary Current Smoker,    breath sounds clear to auscultation       Cardiovascular hypertension, Pt. on medications and Pt. on home beta blockers  Rhythm:Regular Rate:Normal  Nuclear stress test 02/07/16 [facility]): Impression: 1.  No evidence of ischemia on the scan. 2.  Normal ejection fraction at 56%. 3.  No significant symptoms, EKG changes, or arrhythmias occurred. EKG showed SR, right BBB.     Neuro/Psych Anxiety negative neurological ROS     GI/Hepatic negative GI ROS, Neg liver ROS,   Endo/Other  diabetes, Type 2, Oral Hypoglycemic AgentsMorbid obesity  Renal/GU negative Renal ROS     Musculoskeletal  (+) Arthritis ,   Abdominal   Peds  Hematology negative hematology ROS (+)   Anesthesia Other Findings   Reproductive/Obstetrics                            Lab Results  Component Value Date   WBC 13.6 (H) 04/23/2017   HGB 16.7 04/23/2017   HCT 49.3 04/23/2017   MCV 91.5 04/23/2017   PLT 263 04/23/2017   Lab Results  Component Value Date   CREATININE 0.80 04/23/2017   BUN 15 04/23/2017   NA 138 04/23/2017   K 4.5 04/23/2017   CL 106 04/23/2017   CO2 22 04/23/2017    Anesthesia Physical Anesthesia Plan  ASA: III  Anesthesia Plan: General   Post-op Pain Management:    Induction: Intravenous  PONV Risk Score and Plan: 1 and Ondansetron, Dexamethasone and Treatment may vary due to age or medical condition  Airway Management Planned: Oral ETT  Additional Equipment:   Intra-op Plan:    Post-operative Plan: Extubation in OR  Informed Consent: I have reviewed the patients History and Physical, chart, labs and discussed the procedure including the risks, benefits and alternatives for the proposed anesthesia with the patient or authorized representative who has indicated his/her understanding and acceptance.     Plan Discussed with:   Anesthesia Plan Comments:        Anesthesia Quick Evaluation

## 2017-04-30 NOTE — Anesthesia Procedure Notes (Signed)
Procedure Name: Intubation Date/Time: 04/30/2017 1:05 PM Performed by: Gwenyth AllegraAdami, Wendi Lastra, CRNA Pre-anesthesia Checklist: Patient identified, Emergency Drugs available, Suction available, Patient being monitored and Timeout performed Patient Re-evaluated:Patient Re-evaluated prior to induction Oxygen Delivery Method: Circle system utilized Preoxygenation: Pre-oxygenation with 100% oxygen Induction Type: IV induction Ventilation: Oral airway inserted - appropriate to patient size and Mask ventilation without difficulty Laryngoscope Size: Glidescope Grade View: Grade II Tube type: Oral Tube size: 7.5 mm Number of attempts: 1 Airway Equipment and Method: Stylet and Video-laryngoscopy Placement Confirmation: ETT inserted through vocal cords under direct vision,  positive ETCO2 and breath sounds checked- equal and bilateral Secured at: 23 cm Tube secured with: Tape Dental Injury: Teeth and Oropharynx as per pre-operative assessment

## 2017-04-30 NOTE — Transfer of Care (Signed)
Immediate Anesthesia Transfer of Care Note  Patient: Caleb BravoWilliam Love  Procedure(s) Performed: POSTERIOR LUMBAR INTERBODY FUSION - LUMBAR THREE-LUMBAR FOUR (N/A Back)  Patient Location: PACU  Anesthesia Type:General  Level of Consciousness: awake, alert  and oriented  Airway & Oxygen Therapy: Patient Spontanous Breathing and Patient connected to nasal cannula oxygen  Post-op Assessment: Report given to RN and Post -op Vital signs reviewed and stable  Post vital signs: Reviewed and stable  Last Vitals:  Vitals Value Taken Time  BP 134/85 04/30/2017  3:52 PM  Temp 36.5 C 04/30/2017  3:52 PM  Pulse 89 04/30/2017  3:58 PM  Resp 16 04/30/2017  3:58 PM  SpO2 94 % 04/30/2017  3:58 PM  Vitals shown include unvalidated device data.  Last Pain:  Vitals:   04/30/17 1552  TempSrc:   PainSc: 0-No pain         Complications: No apparent anesthesia complications

## 2017-05-01 LAB — GLUCOSE, CAPILLARY
GLUCOSE-CAPILLARY: 161 mg/dL — AB (ref 65–99)
GLUCOSE-CAPILLARY: 157 mg/dL — AB (ref 65–99)

## 2017-05-01 MED ORDER — DIAZEPAM 5 MG PO TABS: 5.0000 mg | ORAL_TABLET | Freq: Four times a day (QID) | ORAL | 0 refills | Status: AC | PRN

## 2017-05-01 MED ORDER — OXYCODONE HCL 10 MG PO TABS: 5.0000 mg | ORAL_TABLET | ORAL | 0 refills | Status: AC | PRN

## 2017-05-01 MED FILL — Thrombin For Soln 20000 Unit: CUTANEOUS | Qty: 1 | Status: AC

## 2017-05-01 NOTE — Anesthesia Postprocedure Evaluation (Signed)
Anesthesia Post Note  Patient: Marijean BravoWilliam Heath  Procedure(s) Performed: POSTERIOR LUMBAR INTERBODY FUSION - LUMBAR THREE-LUMBAR FOUR (N/A Back)     Patient location during evaluation: PACU Anesthesia Type: General Level of consciousness: awake and alert Pain management: pain level controlled Vital Signs Assessment: post-procedure vital signs reviewed and stable Respiratory status: spontaneous breathing, nonlabored ventilation and respiratory function stable Cardiovascular status: blood pressure returned to baseline and stable Postop Assessment: no apparent nausea or vomiting Anesthetic complications: no    Last Vitals:  Vitals:   05/01/17 0745 05/01/17 1213  BP: (!) 142/89 123/78  Pulse: 90 87  Resp: 18 18  Temp: 37 C 36.6 C  SpO2: 95% 97%    Last Pain:  Vitals:   05/01/17 1540  TempSrc:   PainSc: Asleep   Pain Goal: Patients Stated Pain Goal: 4 (05/01/17 1441)               Lowella CurbWarren Ray Brogen Duell

## 2017-05-01 NOTE — Progress Notes (Signed)
Patient alert and oriented, mae's well, voiding adequate amount of urine, swallowing without difficulty,  C/o mild pain at time of discharge. Patient discharged home with family. Script and discharged instructions given to patient. Patient and family stated understanding of instructions given. Patient has an appointment with Dr. Pool  

## 2017-05-01 NOTE — Discharge Instructions (Signed)

## 2017-05-01 NOTE — Evaluation (Addendum)
Occupational Therapy Evaluation and Discharge Patient Details Name: Caleb Love MRN: [id number] DOB: 09-Jan-1961 Today's Date: 05/01/2017    History of Present Illness 57 year old male with progressive lumbar pain with radiation into his lower extremities right greater than left. Now s/p PLIF;  has a past medical history of Anxiety, Arthritis, Diabetes mellitus without complication (HCC), Dyspnea, and Hypertension.    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 Pt currently requires supervision overall for dressing, bathing, standing grooming tasks, toilet transfers, and shower transfers while adhering to back precautions. Educated pt concerning safety, compensatory ADL strategies, brace wear schedule, log roll for bed mobility, and use of toilet aide to avoid bending and twisting for toileting hygiene. Pt reports that he will consider purchasing toilet aide or may use a long handled sponge with wet wipes. OT provided handout detailing all precautions and compensatory strategies. Additionally discussed safe showering positioning, never scrub over incision, pat dry with clean towel and informed pt that he should not shower until cleared by MD. Pt verbalizes and demonstrates understanding of all education and no further acute OT needs identified. Pt reports no further questions/concerns. Pt's father is able to provide supervision but not physical assistance. Acute OT will sign off.    Follow Up Recommendations  No OT follow up;Supervision/Assistance - 24 hour    Equipment Recommendations  Other (comment)(toilet aide)    Recommendations for Other Services       Precautions / Restrictions Precautions Precautions: Back Precaution Booklet Issued: Yes (comment) Precaution Comments: Reviewed all back precautions with pt and provided handout.  Required Braces or Orthoses: Spinal Brace Spinal Brace: Applied in sitting  position(lumbar corset in room; TLSO ordered - notified RN) Restrictions Weight Bearing Restrictions: No      Mobility Bed Mobility Overal bed mobility: Modified Independent             General bed mobility comments: Demonstrates good log roll technique without assistance.   Transfers Overall transfer level: Needs assistance Equipment used: Straight cane Transfers: Sit to/from Stand Sit to Stand: Supervision         General transfer comment: Supervision for safety during mobility.     Balance Overall balance assessment: Needs assistance Sitting-balance support: No upper extremity supported;Feet supported Sitting balance-Leahy Scale: Good     Standing balance support: No upper extremity supported;During functional activity;Single extremity supported Standing balance-Leahy Scale: Fair Standing balance comment: Able to statically stand without UE support. Requires at least single UE support for dynamic standing balance.                            ADL either performed or assessed with clinical judgement   ADL Overall ADL's : Needs assistance/impaired Eating/Feeding: Set up;Sitting   Grooming: Set up;Sitting   Upper Body Bathing: Sitting;Set up   Lower Body Bathing: Sit to/from stand;Supervison/ safety   Upper Body Dressing : Set up;Sitting Upper Body Dressing Details (indicate cue type and reason): including brace Lower Body Dressing: Sit to/from stand;Supervision/safety   Toilet Transfer: Supervision/safety;Ambulation(with straight cane)   Toileting- Clothing Manipulation and Hygiene: Sit to/from stand;Min guard Toileting - Clothing Manipulation Details (indicate cue type and reason): Demonstrated use of toilet aide. Pt reports he is considering this. Also recommended use of long handled sponge and wet-wipes if needed.  Tub/ Shower Transfer: Walk-in shower;Supervision/safety;Ambulation;3 in 1(with cane)   Functional mobility during ADLs:  Supervision/safety;Cane General ADL  Comments: Pt educated concerning compensatory ADL strategies for dressing, bathing, toileting, and grooming tasks to adhere to back precautions. Additionally educated pt concerning brace wear schedule. Pt with order for TLSO brace but lumbar corset delivered to room and notified RN who cleared pt to participate with therapies.      Vision Baseline Vision/History: Wears glasses Wears Glasses: At all times Patient Visual Report: No change from baseline Vision Assessment?: No apparent visual deficits     Perception     Praxis      Pertinent Vitals/Pain Pain Assessment: Faces Faces Pain Scale: Hurts little more Pain Location: incisional pain Pain Descriptors / Indicators: Aching;Operative site guarding Pain Intervention(s): Limited activity within patient's tolerance;Monitored during session;Repositioned     Hand Dominance     Extremity/Trunk Assessment Upper Extremity Assessment Upper Extremity Assessment: Overall WFL for tasks assessed   Lower Extremity Assessment Lower Extremity Assessment: Defer to PT evaluation       Communication Communication Communication: No difficulties   Cognition Arousal/Alertness: Awake/alert Behavior During Therapy: WFL for tasks assessed/performed Overall Cognitive Status: Within Functional Limits for tasks assessed                                     General Comments       Exercises     Shoulder Instructions      Home Living Family/patient expects to be discharged to:: Private residence Living Arrangements: Alone(father lives nearby) Available Help at Discharge: Family;Available 24 hours/day(father can provide supervision but not much physical help) Type of Home: House Home Access: Stairs to enter Entrance Stairs-Number of Steps: 1   Home Layout: Two level;Able to live on main level with bedroom/bathroom Alternate Level Stairs-Number of Steps: flight up to storage rooms upstairs    Bathroom Shower/Tub: Walk-in shower   Bathroom Toilet: Handicapped height     Home Equipment: Environmental consultantWalker - 2 wheels;Cane - single point;Bedside commode          Prior Functioning/Environment Level of Independence: Independent        Comments: 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         OT Problem List: Decreased activity tolerance;Impaired balance (sitting and/or standing);Decreased safety awareness;Decreased knowledge of use of DME or AE;Decreased knowledge of precautions;Pain      OT Treatment/Interventions:      OT Goals(Current goals can be found in the care plan section) Acute Rehab OT Goals Patient Stated Goal: to go home OT Goal Formulation: With patient  OT Frequency:     Barriers to D/C:            Co-evaluation              AM-PAC PT "6 Clicks" Daily Activity     Outcome Measure Help from another person eating meals?: None Help from another person taking care of personal grooming?: A Little Help from another person toileting, which includes using toliet, bedpan, or urinal?: A Little Help from another person bathing (including washing, rinsing, drying)?: A Little Help from another person to put on and taking off regular upper body clothing?: A Little Help from another person to put on and taking off regular lower body clothing?: A Little 6 Click Score: 19   End of Session Equipment Utilized During Treatment: Gait belt;Rolling walker Nurse Communication: Mobility status;Other (comment)(Pt with lumbar corset rather than ordered TLSO)  Activity Tolerance: Patient tolerated treatment well Patient left: in bed;with call bell/phone within reach  OT Visit Diagnosis: Other abnormalities of gait and mobility (R26.89);Pain Pain - part of body: (back)                Time: 4098-1191 OT Time Calculation (min): 30 min Charges:  OT General Charges $OT Visit: 1 Visit OT Evaluation $OT Eval Moderate  Complexity: 1 Mod OT Treatments $Self Care/Home Management : 8-22 mins G-Codes:     Doristine Section, MS OTR/L  Pager: [phone]   Cranford Blessinger A Dezaray Shibuya 05/01/2017, 9:45 AM

## 2017-05-01 NOTE — Discharge Summary (Signed)
Physician Discharge Summary  Patient ID: Caleb BravoWilliam Love MRN: [id number] DOB/AGE: 57/03/1960 57 y.o.  Admit date: 04/30/2017 Discharge date: 05/01/2017  Admission Diagnoses:  Discharge Diagnoses:  Active Problems:   Degenerative spondylolisthesis   Discharged Condition: good  Hospital Course: Patient admitted to the hospital where he underwent uncomplicated lumbar decompression and fusion surgery.  Postoperative patient is doing well.  Preoperative back and lower extremity pain much improved.  Standing and ambulating without difficulty.  Ready for discharge home.  Consults:   Significant Diagnostic Studies:   Treatments:   Discharge Exam: Blood pressure 123/78, pulse 87, temperature 97.8 F (36.6 C), temperature source Oral, resp. rate 18, SpO2 97 %. Awake and alert.  Oriented and appropriate.  Cranial nerve function intact.  Motor and sensory function extremities normal.  Wound clean and dry.  Chest and abdomen benign.  Disposition: Discharge disposition: 01-Home or Self Care        Allergies as of 05/01/2017      Reactions   Baclofen Anaphylaxis   Was taking w/gabapentin   Bee Venom Swelling   Hydrochlorothiazide Swelling, Other (See Comments)   Angioedema, tenses up   Neurontin [gabapentin] Anaphylaxis   Was taking w/baclofen       Medication List    TAKE these medications   ALPRAZolam 0.5 MG tablet Commonly known as:  XANAX Take 0.5 mg by mouth daily as needed for anxiety.   budesonide-formoterol 80-4.5 MCG/ACT inhaler Commonly known as:  SYMBICORT Inhale 2 puffs into the lungs 2 (two) times daily.   busPIRone 10 MG tablet Commonly known as:  BUSPAR Take 10 mg by mouth See admin instructions. 2-3 times a day for anxiety   carvedilol 12.5 MG tablet Commonly known as:  COREG Take 12.5 mg by mouth 2 (two) times daily with a meal.   cetirizine 10 MG tablet Commonly known as:  ZYRTEC Take 10 mg by mouth daily.   citalopram 20 MG tablet Commonly known  as:  CELEXA Take 20 mg by mouth daily.   D-3-5 5000 units capsule Generic drug:  Cholecalciferol Take 5,000 Units by mouth 2 (two) times a week.   diazepam 5 MG tablet Commonly known as:  VALIUM Take 1 tablet (5 mg total) by mouth every 6 (six) hours as needed for muscle spasms.   doxazosin 4 MG tablet Commonly known as:  CARDURA Take 4 mg by mouth at bedtime.   furosemide 40 MG tablet Commonly known as:  LASIX Take 40 mg by mouth daily.   glipiZIDE 10 MG 24 hr tablet Commonly known as:  GLUCOTROL XL Take 10 mg by mouth daily.   HYDROcodone-acetaminophen 5-325 MG tablet Commonly known as:  NORCO/VICODIN Take 1 tablet by mouth every 8 (eight) hours as needed (for pain.).   ibuprofen 200 MG tablet Commonly known as:  ADVIL,MOTRIN Take 200-400 mg by mouth every 8 (eight) hours as needed (for pain.).   isosorbide dinitrate 20 MG tablet Commonly known as:  ISORDIL Take 20 mg by mouth daily.   metFORMIN 1000 MG tablet Commonly known as:  GLUCOPHAGE Take 1,000 mg by mouth 2 (two) times daily with a meal.   multivitamin with minerals Tabs tablet Take 1 tablet by mouth daily.   naphazoline-pheniramine 0.025-0.3 % ophthalmic solution Commonly known as:  NAPHCON-A Place 1 drop into both eyes 4 (four) times daily as needed for eye irritation.   Oxycodone HCl 10 MG Tabs Take 0.5-1 tablets (5-10 mg total) by mouth every 3 (three) hours as needed for severe pain ((  score 7 to 10)).   PROAIR HFA 108 (90 Base) MCG/ACT inhaler Generic drug:  albuterol Inhale 2 puffs into the lungs every 6 (six) hours as needed for wheezing.   vitamin B-12 1000 MCG tablet Commonly known as:  CYANOCOBALAMIN Take 1,000 mcg by mouth daily.   cyanocobalamin 1000 MCG/ML injection Commonly known as:  (VITAMIN B-12) Inject 1,000 mcg into the muscle every 30 (thirty) days.            Durable Medical Equipment  (From admission, onward)        Start     Ordered   04/30/17 1657  DME Walker  rolling  Once    Question:  Patient needs a walker to treat with the following condition  Answer:  Degenerative spondylolisthesis   04/30/17 1656   04/30/17 1657  DME 3 n 1  Once     04/30/17 1656       Signed: Kathaleen Maser Dailey Alberson 05/01/2017, 12:48 PM

## 2017-05-01 NOTE — Evaluation (Signed)
Physical Therapy Evaluation Patient Details Name: Caleb Love MRN: [id number] DOB: 13-Jul-1960 Today's Date: 05/01/2017   History of Present Illness  57 year old male with progressive lumbar pain with radiation into his lower extremities right greater than left. Now s/p PLIF;  has a past medical history of Anxiety, Arthritis, Diabetes mellitus without complication ([place]), Dyspnea, and Hypertension.   Clinical Impression   Patient evaluated by Physical Therapy with no further acute PT needs identified. All education has been completed and the patient has no further questions. Well-equipped; Hopes to get back to regularly working out -- recommend considering Outpt PT for core strengthening and to prep fo rreturn to gym when healed up;  See below for any follow-up Physical Therapy or equipment needs. PT is signing off. Thank you for this referral.     Follow Up Recommendations Outpatient PT(The potential need for Outpatient PT can be addressed at Neurosurgery follow-up appointments. )    Equipment Recommendations  None recommended by PT(well-equipped)    Recommendations for Other Services OT consult(as ordered)     Precautions / Restrictions Precautions Precautions: Back Precaution Booklet Issued: Yes (comment) Precaution Comments: Reviewed all back precautions with pt and provided handout.  Required Braces or Orthoses: Spinal Brace Spinal Brace: Applied in sitting position(lumbar corset in room; TLSO ordered - notified RN) Restrictions Weight Bearing Restrictions: No      Mobility  Bed Mobility Overal bed mobility: Modified Independent             General bed mobility comments: Demonstrates good log roll technique and getting back into bed without assistance.   Transfers Overall transfer level: Needs assistance Equipment used: Straight cane Transfers: Sit to/from Stand Sit to Stand: Supervision         General transfer comment: Supervision for safety during mobility.    Ambulation/Gait Ambulation/Gait assistance: Supervision Ambulation Distance (Feet): 240 Feet Assistive device: Straight cane;Rolling walker (2 wheeled) Gait Pattern/deviations: Step-through pattern;Decreased step length - right;Decreased step length - left Gait velocity: slowed   General Gait Details: Started walk with straight cane, but noted tendency to reach out for UE support bilaterally; managed RW very well; tells me he has one at home  Stairs Stairs: Yes Stairs assistance: Min guard Stair Management: One rail Right;With cane;Step to pattern;Forwards Number of Stairs: 4 General stair comments: Cues for sequence  Wheelchair Mobility    Modified Rankin (Stroke Patients Only)       Balance Overall balance assessment: Needs assistance Sitting-balance support: No upper extremity supported;Feet supported Sitting balance-Leahy Scale: Good     Standing balance support: No upper extremity supported;During functional activity;Single extremity supported Standing balance-Leahy Scale: Fair Standing balance comment: Able to statically stand without UE support. Requires at least single UE support for dynamic standing balance.                              Pertinent Vitals/Pain Pain Assessment: 0-10 Pain Score: 7  Faces Pain Scale: Hurts little more Pain Location: incisional pain Pain Descriptors / Indicators: Aching;Operative site guarding Pain Intervention(s): Monitored during session    Home Living Family/patient expects to be discharged to:: Private residence Living Arrangements: Alone(father lives nearby) Available Help at Discharge: Family;Available 24 hours/day(father can provide supervision but not much physical help) Type of Home: House Home Access: Stairs to enter Entrance Stairs-Rails: Right Entrance Stairs-Number of Steps: 2 Home Layout: Two level;Able to live on main level with bedroom/bathroom Home Equipment: Gilford Rile - 2 wheels;Cane - single  point;Bedside commode      Prior Function Level of Independence: Independent         Comments: 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      Hand Dominance        Extremity/Trunk Assessment   Upper Extremity Assessment Upper Extremity Assessment: Overall WFL for tasks assessed    Lower Extremity Assessment Lower Extremity Assessment: Generalized weakness       Communication   Communication: No difficulties  Cognition Arousal/Alertness: Awake/alert Behavior During Therapy: WFL for tasks assessed/performed Overall Cognitive Status: Within Functional Limits for tasks assessed                                        General Comments      Exercises     Assessment/Plan    PT Assessment All further PT needs can be met in the next venue of care  PT Problem List Decreased strength;Decreased activity tolerance;Decreased balance       PT Treatment Interventions      PT Goals (Current goals can be found in the Care Plan section)  Acute Rehab PT Goals Patient Stated Goal: to go home PT Goal Formulation: All assessment and education complete, DC therapy    Frequency     Barriers to discharge        Co-evaluation               AM-PAC PT "6 Clicks" Daily Activity  Outcome Measure Difficulty turning over in bed (including adjusting bedclothes, sheets and blankets)?: A Little Difficulty moving from lying on back to sitting on the side of the bed? : None Difficulty sitting down on and standing up from a chair with arms (e.g., wheelchair, bedside commode, etc,.)?: A Little Help needed moving to and from a bed to chair (including a wheelchair)?: None Help needed walking in hospital room?: None Help needed climbing 3-5 steps with a railing? : A Little 6 Click Score: 21    End of Session Equipment Utilized During Treatment: Back brace Activity Tolerance: Patient tolerated treatment  well Patient left: in bed;with call bell/phone within reach Nurse Communication: Mobility status PT Visit Diagnosis: Muscle weakness (generalized) (M62.81);Difficulty in walking, not elsewhere classified (R26.2)    Time: 7628-3151 PT Time Calculation (min) (ACUTE ONLY): 23 min   Charges:   PT Evaluation $PT Eval Low Complexity: 1 Low PT Treatments $Gait Training: 8-22 mins   PT G Codes:        Roney Marion, PT  Acute Rehabilitation Services Pager [phone] Office [phone]   Colletta [place] 05/01/2017, 12:40 PM

## 2017-05-02 MED FILL — Sodium Chloride IV Soln 0.9%: INTRAVENOUS | Qty: 1000 | Status: AC

## 2017-05-02 MED FILL — Heparin Sodium (Porcine) Inj 1000 Unit/ML: INTRAMUSCULAR | Qty: 30 | Status: AC

## 2019-04-05 ENCOUNTER — Ambulatory Visit: Payer: BLUE CROSS/BLUE SHIELD | Attending: Internal Medicine

## 2019-04-05 DIAGNOSIS — Z23 Encounter for immunization: Secondary | ICD-10-CM

## 2019-04-05 NOTE — Progress Notes (Signed)
   Covid-19 Vaccination Clinic  Name:  Quamel Fitzmaurice    MRN: [id number] DOB: [birth year]  04/05/2019  Mr. Mickelson was observed post Covid-19 immunization for 15 minutes without incident. He was provided with Vaccine Information Sheet and instruction to access the V-Safe system.   Mr. Carattini was instructed to call 911 with any severe reactions post vaccine: Marland Kitchen Difficulty breathing  . Swelling of face and throat  . A fast heartbeat  . A bad rash all over body  . Dizziness and weakness   Immunizations Administered    Name Date Dose VIS Date Route   Pfizer COVID-19 Vaccine 04/05/2019  3:31 PM 0.3 mL 12/20/2018 Intramuscular   Manufacturer: [employer], [employer]   Lot: [id number]   NDC: [address]-5

## 2019-04-30 ENCOUNTER — Ambulatory Visit: Payer: BLUE CROSS/BLUE SHIELD | Attending: Internal Medicine

## 2019-04-30 DIAGNOSIS — Z23 Encounter for immunization: Secondary | ICD-10-CM

## 2019-04-30 NOTE — Progress Notes (Signed)
   Covid-19 Vaccination Clinic  Name:  Caleb Love    MRN: [id number] DOB: 12/09/1960  04/30/2019  Mr. [name] was observed post Covid-19 immunization for 15 minutes without incident. He was provided with Vaccine Information Sheet and instruction to access the V-Safe system.   Mr. [name] was instructed to call 911 with any severe reactions post vaccine: Marland Kitchen Difficulty breathing  . Swelling of face and throat  . A fast heartbeat  . A bad rash all over body  . Dizziness and weakness   Immunizations Administered    Name Date Dose VIS Date Route   Pfizer COVID-19 Vaccine 04/30/2019 12:53 PM 0.3 mL 03/05/2018 Intramuscular   Manufacturer: [employer], [employer]   Lot: [id number]   NDC: [address]-8

## 2019-07-06 IMAGING — CT CT L SPINE W/ CM
1 of 7 series · 5 of 14 positions shown, 7 images · non-contrast
Comparison: MRI of the lumbar spine at [HOSPITAL] 10/12/2016.

CLINICAL DATA: Low back pain extending into the right lower
extremity. Pain in the posterior thigh.
TECHNIQUE: Contiguous axial images were obtained through the Lumbar spine after
the intrathecal infusion of infusion. Coronal and sagittal
reconstructions were obtained of the axial image sets.

[Series 3: l spine soft · axial · 0.39mm/px · z∈[-255,-99]mm · 5 of 80 slices shown, 7 images]
[im 14/80  soft-tissue]
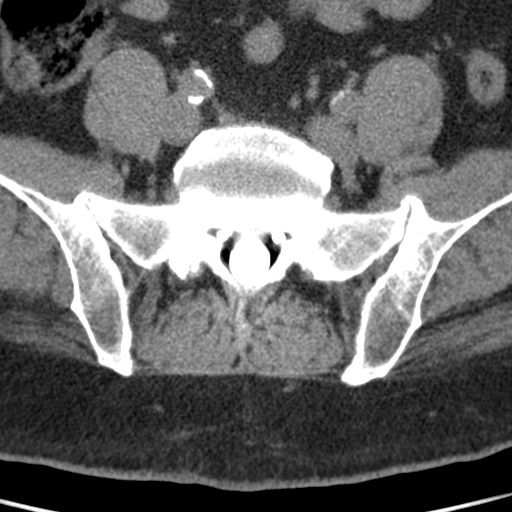
[im 14/80  bone]
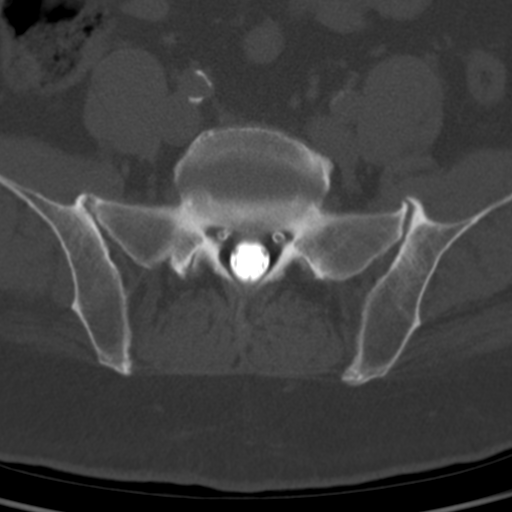
[im 27/80  bone]
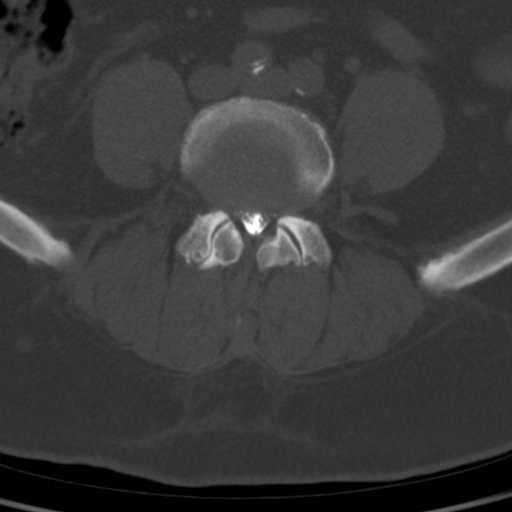
[im 40/80  bone]
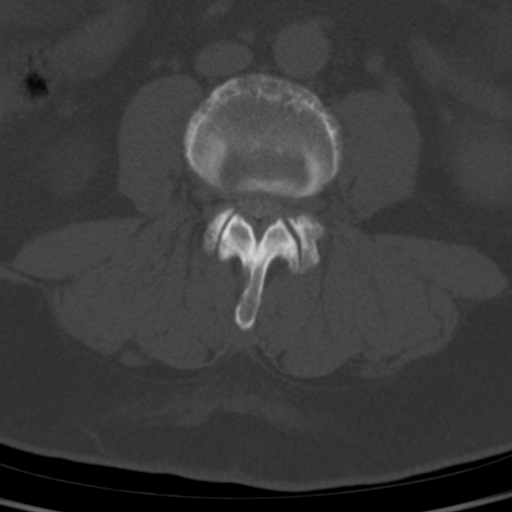
[im 53/80  bone]
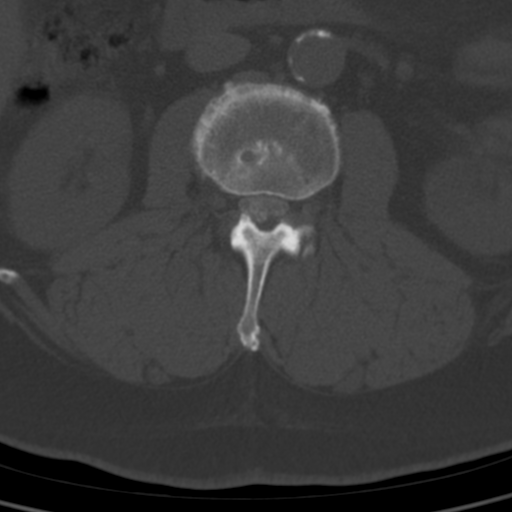
[im 66/80  soft-tissue]
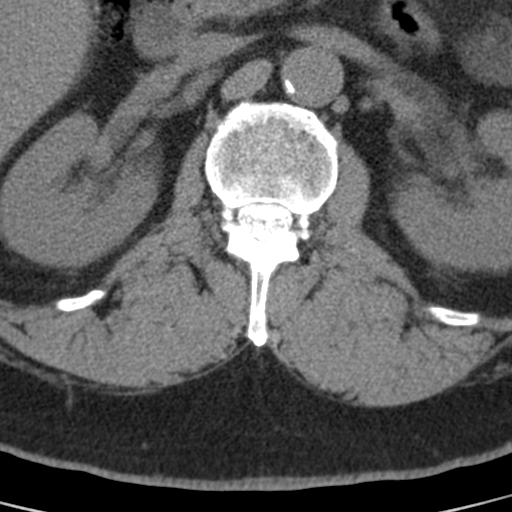
[im 66/80  bone]
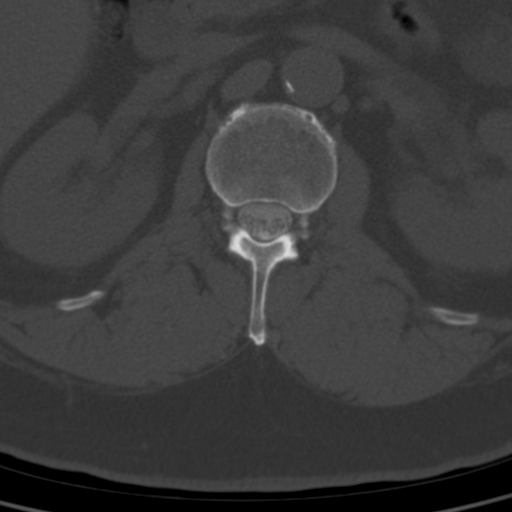

[5 of 14 positions shown; findings below may reference images not displayed]

EXAM:
LUMBAR MYELOGRAM

FLUOROSCOPY TIME:  Radiation Exposure Index (as provided by the
fluoroscopic device): 197.21 uGy*m2

If the device does not provide the exposure index:

Fluoroscopy Time:  12 seconds

Number of Acquired Images:  15

PROCEDURE:
After thorough discussion of risks and benefits of the procedure
including bleeding, infection, injury to nerves, blood vessels,
adjacent structures as well as headache and CSF leak, written and
oral informed consent was obtained. Consent was obtained by Dr.
Srilakshmi Mayberry. Time out form was completed.

Patient was positioned prone on the fluoroscopy table. Local
anesthesia was provided with 1% lidocaine without epinephrine after
prepped and draped in the usual sterile fashion. Puncture was
performed at L5-S1 using a 3 1/2 inch 22-gauge spinal needle via
right paramedian approach. Using a single pass through the dura, the
needle was placed within the thecal sac, with return of clear CSF.
15 mL of Isovue Y-YKK was injected into the thecal sac, with normal
opacification of the nerve roots and cauda equina consistent with
free flow within the subarachnoid space.

I personally performed the lumbar puncture and administered the
intrathecal contrast. I also personally supervised acquisition of
the myelogram images.
FINDINGS: LUMBAR MYELOGRAM FINDINGS:

Severe central canal stenosis is again seen at L3-4. There is
crowding of nerve roots above this level. Moderate central canal
stenosis is present at L4-5 with subarticular narrowing bilaterally.

Grade 1 anterolisthesis at L3-4 is exaggerated with standing. There
is no significant motion through flexion and extension.

CT LUMBAR MYELOGRAM FINDINGS:

The lumbar spine is imaged from the mid level of T12 through the
sacrum. Five non rib-bearing lumbar type vertebral bodies are
present. 3 mm anterolisthesis is present at L3-4. AP alignment is
otherwise anatomic. Vertebral body heights are maintained.

Limited imaging of the abdomen demonstrates atherosclerotic
calcifications. There is no aneurysm.

L1-2: Mild disc bulging and facet hypertrophy is present. There is
no significant stenosis.

L2-3: A broad-based disc protrusion is present. Asymmetric facet
hypertrophy is noted on the left. Moderate left and mild right
subarticular stenosis is present. The foramina are patent.

L3-4: There is uncovering of a broad-based disc protrusion. Facet
hypertrophy and short pedicles contribute to severe central canal
stenosis. The disc extends into both foramina with mild foraminal
narrowing. There is tortuosity of nerve roots above this level.

L4-5: A broad-based disc protrusion is asymmetric to the right. Mild
subarticular narrowing is worse right than left. Facet hypertrophy
is worse on the right. Mild bilateral foraminal narrowing is
evident.

L5-S1: Mild disc bulging is present. Mild facet hypertrophy is worse
on the right. Minimal right subarticular and foraminal narrowing is
present.
IMPRESSION: 1. Severe central canal stenosis at L3-4 with tortuosity of nerve
roots above this level.
2. Mild bilateral foraminal narrowing at L3-4.
3. Moderate left and mild right subarticular narrowing at L2-3
without significant foraminal stenosis.
4. Mild subarticular and foraminal narrowing bilaterally at L4-5 is
worse on the right.
5. Minimal right subarticular and foraminal stenosis at L5-S1.
